# Patient Record
Sex: Male | Born: 1965 | Race: White | Hispanic: No | Marital: Single | Smoking: Never smoker
Health system: Southern US, Community
[De-identification: ages and names within clinical notes are randomized; demographics above are authoritative.]

## PROBLEM LIST (undated history)

## (undated) HISTORY — PX: NO PAST SURGERIES: SHX2092

---

## 2013-03-12 ENCOUNTER — Ambulatory Visit: Payer: BC Managed Care – PPO | Admitting: Family Medicine

## 2013-03-12 VITALS — BP 110/76 | HR 95 | Temp 100.3°F | Resp 18 | Ht 73.0 in | Wt 223.8 lb

## 2013-03-12 DIAGNOSIS — R509 Fever, unspecified: Secondary | ICD-10-CM

## 2013-03-12 DIAGNOSIS — J029 Acute pharyngitis, unspecified: Secondary | ICD-10-CM

## 2013-03-12 DIAGNOSIS — B9789 Other viral agents as the cause of diseases classified elsewhere: Secondary | ICD-10-CM

## 2013-03-12 DIAGNOSIS — B349 Viral infection, unspecified: Secondary | ICD-10-CM

## 2013-03-12 DIAGNOSIS — R350 Frequency of micturition: Secondary | ICD-10-CM

## 2013-03-12 DIAGNOSIS — R319 Hematuria, unspecified: Secondary | ICD-10-CM

## 2013-03-12 LAB — POCT URINALYSIS DIPSTICK
Nitrite, UA: NEGATIVE
Spec Grav, UA: 1.015
Urobilinogen, UA: 0.2

## 2013-03-12 LAB — POCT CBC
HCT, POC: 46.7 % (ref 43.5–53.7)
Hemoglobin: 15.4 g/dL (ref 14.1–18.1)
Lymph, poc: 1.1 (ref 0.6–3.4)
MCH, POC: 33.5 pg — AB (ref 27–31.2)
MCHC: 33 g/dL (ref 31.8–35.4)
MPV: 8.2 fL (ref 0–99.8)
POC Granulocyte: 4.3 (ref 2–6.9)
POC LYMPH PERCENT: 19 %L (ref 10–50)
POC MID %: 6.9 %M (ref 0–12)
RDW, POC: 12.4 %
WBC: 5.8 10*3/uL (ref 4.6–10.2)

## 2013-03-12 LAB — POCT UA - MICROSCOPIC ONLY
Casts, Ur, LPF, POC: NEGATIVE
Crystals, Ur, HPF, POC: NEGATIVE
Mucus, UA: NEGATIVE
Yeast, UA: NEGATIVE

## 2013-03-12 NOTE — Progress Notes (Signed)
[address]   [place], [place]  [address]   [phone]  Subjective:    Patient ID: Kyle Leonard, male    DOB: 12-Sep-1965, 47 y.o.   MRN: [id number]  Cough Associated symptoms include chills, a fever, headaches and a sore throat. Pertinent negatives include no ear pain, rash, rhinorrhea, shortness of breath or wheezing.  Fever  Associated symptoms include abdominal pain, coughing, headaches and a sore throat. Pertinent negatives include no congestion, diarrhea, ear pain, nausea, rash, vomiting or wheezing.   This 47 y.o. male presents for evaluation of cough, fever.  Onset two days ago.  +fever Tmax unknown; taking Advil.  Mild headache.  +ST mildly today. No ear pain.  No rhinorrhea.  No nasal congestion.  +mild coughing last night.  No SOB; no sputum production. No eye redness.  No n/v.  No diarrhea.  +abdominal pain.  No sick contacts.  Investment banker, corporate.  No recent travel.  Took Nyquil.  +myalgias.  No tobacco.  No flu vaccine.  No dysuria, urgency,hematuria but +frequency; married; one sexual partner in past year.  PCP:  none  Review of Systems  Constitutional: Positive for fever, chills, diaphoresis and fatigue.  HENT: Positive for sore throat. Negative for congestion, ear pain, rhinorrhea, trouble swallowing and voice change.   Respiratory: Positive for cough. Negative for shortness of breath, wheezing and stridor.   Gastrointestinal: Positive for abdominal pain. Negative for nausea, vomiting and diarrhea.  Genitourinary: Positive for frequency. Negative for dysuria, hematuria and flank pain.  Skin: Negative for rash.  Neurological: Positive for headaches. Negative for dizziness and light-headedness.   History reviewed. No pertinent past medical history. History reviewed. No pertinent past surgical history. No Known Allergies No current outpatient prescriptions on file prior to visit.   No current facility-administered medications on file prior to visit.   History   Social  History  . Marital Status: Single    Spouse Name: N/A    Number of Children: N/A  . Years of Education: N/A   Occupational History  . employed     Investment banker, corporate   Social History Main Topics  . Smoking status: Never Smoker   . Smokeless tobacco: Not on file  . Alcohol Use: Not on file  . Drug Use: No  . Sexual Activity: Yes   Other Topics Concern  . Not on file   Social History Narrative  . No narrative on file       Objective:   Physical Exam  Constitutional: He is oriented to person, place, and time. He appears well-developed and well-nourished. No distress.  HENT:  Head: Normocephalic and atraumatic.  Right Ear: External ear normal.  Left Ear: External ear normal.  Nose: Nose normal.  Mouth/Throat: Oropharynx is clear and moist.  Eyes: Conjunctivae and EOM are normal. Pupils are equal, round, and reactive to light.  Neck: Normal range of motion. Neck supple.  Cardiovascular: Normal rate, regular rhythm and normal heart sounds.   No murmur heard. Pulmonary/Chest: Effort normal and breath sounds normal. He has no wheezes. He has no rales.  Abdominal: Soft. Bowel sounds are normal. He exhibits no distension. There is no tenderness. There is no rebound and no guarding.  Lymphadenopathy:    He has no cervical adenopathy.  Neurological: He is alert and oriented to person, place, and time.  Skin: Skin is warm and dry. No rash noted. He is not diaphoretic.  Psychiatric: He has a normal mood and affect. His behavior is normal.  Results for orders placed in visit on 03/12/13  POCT INFLUENZA A/B      Result Value Range   Influenza A, POC Negative     Influenza B, POC Negative    POCT CBC      Result Value Range   WBC 5.8  4.6 - 10.2 K/uL   Lymph, poc 1.1  0.6 - 3.4   POC LYMPH PERCENT 19.0  10 - 50 %L   MID (cbc) 0.4  0 - 0.9   POC MID % 6.9  0 - 12 %M   POC Granulocyte 4.3  2 - 6.9   Granulocyte percent 74.1  37 - 80 %G   RBC 4.60 (*) 4.69 - 6.13 M/uL    Hemoglobin 15.4  14.1 - 18.1 g/dL   HCT, POC 40.9  81.1 - 53.7 %   MCV 101.6 (*) 80 - 97 fL   MCH, POC 33.5 (*) 27 - 31.2 pg   MCHC 33.0  31.8 - 35.4 g/dL   RDW, POC 91.4     Platelet Count, POC 189  142 - 424 K/uL   MPV 8.2  0 - 99.8 fL  POCT RAPID STREP A (OFFICE)      Result Value Range   Rapid Strep A Screen Negative  Negative  POCT UA - MICROSCOPIC ONLY      Result Value Range   WBC, Ur, HPF, POC 0-1     RBC, urine, microscopic 3-6     Bacteria, U Microscopic trace     Mucus, UA neg     Epithelial cells, urine per micros neg     Crystals, Ur, HPF, POC neg     Casts, Ur, LPF, POC neg     Yeast, UA neg    POCT URINALYSIS DIPSTICK      Result Value Range   Color, UA yellow     Clarity, UA clear     Glucose, UA neg     Bilirubin, UA neg     Ketones, UA neg     Spec Grav, UA 1.015     Blood, UA moderate     pH, UA 6.5     Protein, UA trace     Urobilinogen, UA 0.2     Nitrite, UA neg     Leukocytes, UA Negative         Assessment & Plan:  Fever, unspecified - Plan: POCT Influenza A/B, POCT CBC, POCT rapid strep A, POCT UA - Microscopic Only, POCT urinalysis dipstick, Culture, Group A Strep  Urinary frequency - Plan: POCT UA - Microscopic Only, POCT urinalysis dipstick  Sore throat - Plan: POCT CBC, POCT rapid strep A, Culture, Group A Strep  Viral syndrome  Hematuria - Plan: Urine culture  1.  Fever:  New. Associated with sore throat, cough, abdominal pain.  Recommend supportive care with rest,fluids, Nyquil, Mucinex. RTC for acute worsening. 2.  Hematuria: New. Associated with fever, abdominal pain, urinary frequency; send urine culture.  No suggestion of acute nephrolithiasis by exam.  RTC 2-4 weeks for repeat urine.

## 2013-03-12 NOTE — Patient Instructions (Signed)
Viral Syndrome  You or your child has Viral Syndrome. It is the most common infection causing "colds" and infections in the nose, throat, sinuses, and breathing tubes. Sometimes the infection causes nausea, vomiting, or diarrhea. The germ that causes the infection is a virus. No antibiotic or other medicine will kill it. There are medicines that you can take to make you or your child more comfortable.   HOME CARE INSTRUCTIONS    Rest in bed until you start to feel better.   If you have diarrhea or vomiting, eat small amounts of crackers and toast. Soup is helpful.   Do not give aspirin or medicine that contains aspirin to children.   Only take over-the-counter or prescription medicines for pain, discomfort, or fever as directed by your caregiver.  SEEK IMMEDIATE MEDICAL CARE IF:    You or your child has not improved within one week.   You or your child has pain that is not at least partially relieved by over-the-counter medicine.   Thick, colored mucus or blood is coughed up.   Discharge from the nose becomes thick yellow or green.   Diarrhea or vomiting gets worse.   There is any major change in your or your child's condition.   You or your child develops a skin rash, stiff neck, severe headache, or are unable to hold down food or fluid.   You or your child has an oral temperature above 102 F (38.9 C), not controlled by medicine.   Your baby is older than 3 months with a rectal temperature of 102 F (38.9 C) or higher.   Your baby is 3 months old or younger with a rectal temperature of 100.4 F (38 C) or higher.  Document Released: 04/23/2006 Document Revised: 07/31/2011 Document Reviewed: 04/24/2007  Exit[employer] Patient Information 2014 [employer].

## 2013-03-14 LAB — CULTURE, GROUP A STREP: Organism ID, Bacteria: NORMAL

## 2013-03-18 NOTE — Progress Notes (Signed)
Left Message regarding scheduling appointment with Dr Katrinka Blazing for a f/u

## 2013-03-20 ENCOUNTER — Telehealth: Payer: Self-pay | Admitting: Radiology

## 2013-03-20 NOTE — Telephone Encounter (Signed)
Spoke to patient about labs, he is feeling better.

## 2013-04-15 ENCOUNTER — Encounter: Payer: Self-pay | Admitting: Family Medicine

## 2013-04-15 ENCOUNTER — Ambulatory Visit: Payer: BC Managed Care – PPO | Admitting: Family Medicine

## 2013-04-15 VITALS — BP 126/72 | HR 69 | Temp 97.9°F | Resp 16 | Ht 74.5 in | Wt 228.0 lb

## 2013-04-15 DIAGNOSIS — Z23 Encounter for immunization: Secondary | ICD-10-CM

## 2013-04-15 DIAGNOSIS — R319 Hematuria, unspecified: Secondary | ICD-10-CM

## 2013-04-15 LAB — POCT URINALYSIS DIPSTICK
Bilirubin, UA: NEGATIVE
Blood, UA: NEGATIVE
Ketones, UA: NEGATIVE
Leukocytes, UA: NEGATIVE
Spec Grav, UA: 1.02
Urobilinogen, UA: 0.2
pH, UA: 7

## 2013-04-15 LAB — POCT UA - MICROSCOPIC ONLY
Mucus, UA: NEGATIVE
RBC, urine, microscopic: NEGATIVE

## 2013-04-15 NOTE — Progress Notes (Signed)
Subjective:    Patient ID: Kyle Leonard, male    DOB: 10/14/1965, 47 y.o.   MRN: [id number]  HPI This 47 y.o. male presents for one month follow-up of the following:  1. Viral syndrome: acute illness continued x 5 days; fever x 5 days; mild sore throat; mild cough; no other associated symptoms other than body aches. Convinced that had the flu.  No rhinorrhea, nasal congestion, SOB, abdominal pain, n/v/d.  2.  Hematuria: present at visit; presenting for recheck. No dysuria, gross hematuria, urgency; +frequency at time of visit.  No previous hematuria in past.  Feels well today.  3. Flu vaccine: agreeable.   Review of Systems  Constitutional: Negative for fever, chills, diaphoresis and fatigue.  HENT: Negative for congestion, ear pain, postnasal drip, rhinorrhea, sneezing, sore throat and trouble swallowing.   Respiratory: Negative for cough and shortness of breath.   Genitourinary: Negative for dysuria, urgency, frequency, hematuria, flank pain, discharge, penile swelling, scrotal swelling, genital sores, penile pain and testicular pain.   History reviewed. No pertinent past medical history. History reviewed. No pertinent past surgical history. No Known Allergies History   Social History  . Marital Status: Single    Spouse Name: N/A    Number of Children: N/A  . Years of Education: N/A   Occupational History  . employed     Investment banker, corporate   Social History Main Topics  . Smoking status: Never Smoker   . Smokeless tobacco: Not on file  . Alcohol Use: Not on file  . Drug Use: No  . Sexual Activity: Yes   Other Topics Concern  . Not on file   Social History Narrative  . No narrative on file   History reviewed. No pertinent family history.     Objective:   Physical Exam  Nursing note and vitals reviewed. Constitutional: He is oriented to person, place, and time. He appears well-developed and well-nourished. No distress.  HENT:  Head: Normocephalic and atraumatic.    Right Ear: External ear normal.  Left Ear: External ear normal.  Nose: Nose normal.  Mouth/Throat: Oropharynx is clear and moist.  Eyes: Conjunctivae and EOM are normal. Pupils are equal, round, and reactive to light.  Neck: Normal range of motion. Neck supple. Carotid bruit is not present. No thyromegaly present.  Cardiovascular: Normal rate, regular rhythm, normal heart sounds and intact distal pulses.  Exam reveals no gallop and no friction rub.   No murmur heard. Pulmonary/Chest: Effort normal and breath sounds normal. He has no wheezes. He has no rales.  Abdominal: Soft. Bowel sounds are normal. He exhibits no distension and no mass. There is no tenderness. There is no rebound and no guarding.  Lymphadenopathy:    He has no cervical adenopathy.  Neurological: He is alert and oriented to person, place, and time. No cranial nerve deficit.  Skin: Skin is warm and dry. No rash noted. He is not diaphoretic.  Psychiatric: He has a normal mood and affect. His behavior is normal.          Results for orders placed in visit on 04/15/13  POCT UA - MICROSCOPIC ONLY      Result Value Range   WBC, Ur, HPF, POC 0-1     RBC, urine, microscopic neg     Bacteria, U Microscopic trace     Mucus, UA neg     Epithelial cells, urine per micros 0-1     Crystals, Ur, HPF, POC neg  Casts, Ur, LPF, POC neg     Yeast, UA neg    POCT URINALYSIS DIPSTICK      Result Value Range   Color, UA yellow     Clarity, UA clear     Glucose, UA neg     Bilirubin, UA neg     Ketones, UA neg     Spec Grav, UA 1.020     Blood, UA neg     pH, UA 7.0     Protein, UA neg     Urobilinogen, UA 0.2     Nitrite, UA neg     Leukocytes, UA Negative      Assessment & Plan:  Hematuria - Plan: POCT UA - Microscopic Only, POCT urinalysis dipstick  Need for prophylactic vaccination and inoculation against influenza  1. Hematuria:  New at last visit and now resolved. Asymptomatic at this time. 2.  S/p flu  vaccine.  No orders of the defined types were placed in this encounter.   Nilda Simmer, M.D.  [facility] [address] [place], [place]  [address] [phone] phone [phone] fax

## 2016-02-16 ENCOUNTER — Ambulatory Visit (INDEPENDENT_AMBULATORY_CARE_PROVIDER_SITE_OTHER): Payer: BLUE CROSS/BLUE SHIELD | Admitting: Family Medicine

## 2016-02-16 ENCOUNTER — Ambulatory Visit (INDEPENDENT_AMBULATORY_CARE_PROVIDER_SITE_OTHER): Payer: BLUE CROSS/BLUE SHIELD

## 2016-02-16 VITALS — BP 124/82 | HR 78 | Temp 97.7°F | Resp 17 | Ht 74.5 in | Wt 214.0 lb

## 2016-02-16 DIAGNOSIS — W540XXA Bitten by dog, initial encounter: Secondary | ICD-10-CM | POA: Diagnosis not present

## 2016-02-16 DIAGNOSIS — M7989 Other specified soft tissue disorders: Secondary | ICD-10-CM

## 2016-02-16 DIAGNOSIS — S60511A Abrasion of right hand, initial encounter: Secondary | ICD-10-CM | POA: Diagnosis not present

## 2016-02-16 DIAGNOSIS — R2231 Localized swelling, mass and lump, right upper limb: Secondary | ICD-10-CM

## 2016-02-16 LAB — CBC WITH DIFFERENTIAL/PLATELET
Basophils Absolute: 56 cells/uL (ref 0–200)
Basophils Relative: 1 %
EOS ABS: 168 {cells}/uL (ref 15–500)
Eosinophils Relative: 3 %
HEMATOCRIT: 41.2 % (ref 38.5–50.0)
Hemoglobin: 13.7 g/dL (ref 13.2–17.1)
LYMPHS PCT: 34 %
Lymphs Abs: 1904 cells/uL (ref 850–3900)
MCH: 32.1 pg (ref 27.0–33.0)
MCHC: 33.3 g/dL (ref 32.0–36.0)
MCV: 96.5 fL (ref 80.0–100.0)
MONO ABS: 448 {cells}/uL (ref 200–950)
MONOS PCT: 8 %
MPV: 9.5 fL (ref 7.5–12.5)
NEUTROS PCT: 54 %
Neutro Abs: 3024 cells/uL (ref 1500–7800)
PLATELETS: 227 10*3/uL (ref 140–400)
RBC: 4.27 MIL/uL (ref 4.20–5.80)
RDW: 12.7 % (ref 11.0–15.0)
WBC: 5.6 10*3/uL (ref 3.8–10.8)

## 2016-02-16 MED ORDER — DOXYCYCLINE HYCLATE 100 MG PO TABS: 100.0000 mg | ORAL_TABLET | Freq: Two times a day (BID) | ORAL | 0 refills | Status: DC

## 2016-02-16 MED ORDER — AMOXICILLIN-POT CLAVULANATE 875-125 MG PO TABS: 1.0000 | ORAL_TABLET | Freq: Two times a day (BID) | ORAL | 0 refills | Status: DC

## 2016-02-16 NOTE — Progress Notes (Addendum)
Kyle Leonard is a 50 y.o. male who presents to [facility] today for dog bite:  1.  Dog bite to Right hand.:  Patient suffered a dog bite to his right hand one week ago today. It was the patient's own pet and he was stuck in a fence. The patient was trying to help the dog become untangled when that became upset and bit his hand. The dog is up-to-date on rabies vaccinations.    Since then the patient has been taking regular ibuprofen to help with the swelling. He is also applying ice. He has noted persistent swelling in his right hand since the bite. The pain is fairly well controlled except when he tries to completely extend all of his fingers. The swelling is over the third metacarpal joint. Maximal point tenderness is here. He noticed some redness which started yesterday. Heat in the area as well. Evidently he had an accident about 25 years ago and has only small portion of that joint remaining.  No nausea vomiting. No fevers or chills. No red streaks extending up his arm.  ROS as above.   PMH reviewed. Patient is a nonsmoker.   No past medical history on file. No past surgical history on file.  Medications reviewed. Current Outpatient Prescriptions  Medication Sig Dispense Refill  . ibuprofen (ADVIL,MOTRIN) 200 MG tablet Take 200 mg by mouth every 6 (six) hours as needed.     No current facility-administered medications for this visit.      Physical Exam:  BP 124/82 (BP Location: Right Arm, Patient Position: Sitting, Cuff Size: Large)   Pulse 78   Temp 97.7 F (36.5 C) (Oral)   Resp 17   Ht 6' 2.5" (1.892 m)   Wt 214 lb (97.1 kg)   SpO2 99%   BMI 27.11 kg/m  Gen:  Alert, cooperative patient who appears stated age in no acute distress.  Vital signs reviewed. HEENT: EOMI,  MMM MSK:  Left ear within normal limits. Right hand with fairly significant edema on dorsum of hand. Does have a healing abrasion located directly over third MCP joint. This area is  edematous and erythematous. I do note heat being given off. Patient is able to extend his fingers to almost 180 but this does reproduce pain in his hand. He is more comfortable letting his fingers rested about 90 flexion. Sensation and neurovascularly intact.  Tenderness is mild but present along the polar aspect of his MCP joint as well.  Assessment and Plan:  1.  Right hand infection: - with questionable MCP joint involvement.  Reassured by lack of progression over past week. Hand xray showed dorsal swelling with normal bone appearance, no evidence of osteomyelitis. - Contacted and surgeon on call for today who is Dr. Mina MarbleWeingold, who is currently in surgery. I spoke with his nurse assistant who spoke with the surgeon. Recommendations were either to send him straight to the ER, or due to his reliability and lack of systemic symptoms, start him on antibiotics and recheck in 48 hours. -I would favor the 48 hour approach. I spoke with the patient who also favors this. -Starting him on Augmentin for the hand right. Also currently with doxycycline for any concerns for MRSA. -We are getting a white count today. Can repeat on Friday. -Patient will make a appointment to come back on Friday to be reevaluated. - Does NOT appear that patient has had a recent tetanus.  Discovered this after patient had left.  NEEDS to obtain tetanus next visit on Friday.

## 2016-02-16 NOTE — Patient Instructions (Addendum)
I will call you in a just a little bit with the recommendations.  I'll also send in antibiotics for you at that point.   Blood test today.      IF you received an x-Kader today, you will receive an invoice from [facility]. Please contact [facility] at [phone] with questions or concerns regarding your invoice.   IF you received labwork today, you will receive an invoice from [facility]. Please contact [facility] at [phone] with questions or concerns regarding your invoice.   Our billing staff will not be able to assist you with questions regarding bills from these companies.  You will be contacted with the lab results as soon as they are available. The fastest way to get your results is to activate your My Chart account. Instructions are located on the last page of this paperwork. If you have not heard from us regarding the results in 2 weeks, please contact this office.

## 2016-02-18 ENCOUNTER — Ambulatory Visit: Payer: BLUE CROSS/BLUE SHIELD

## 2016-02-26 ENCOUNTER — Telehealth: Payer: Self-pay

## 2016-02-26 NOTE — Telephone Encounter (Signed)
PATIENT SAW DR. Gwendolyn GrantWALDEN ABOUT A WEEK AGO FOR A DOG BITE ON HIS (R) HAND. THEY TALKED ABOUT HIM GOING TO SEE A HAND SPECIALIST. HE WOULD LIKE DR. Gwendolyn GrantWALDEN TO GO AHEAD AND PUT IN THE REFERRAL. BEST PHONE [phone] (CELL)  MBC

## 2016-02-28 ENCOUNTER — Telehealth: Payer: Self-pay | Admitting: Emergency Medicine

## 2016-02-28 ENCOUNTER — Telehealth: Payer: Self-pay

## 2016-02-28 NOTE — Telephone Encounter (Signed)
Left a message explaining to patient he will need to return to clinic for re-evaluation of hand before referral.

## 2016-02-28 NOTE — Telephone Encounter (Signed)
Wants a referral to hand specialist.    [phone]

## 2016-02-28 NOTE — Telephone Encounter (Signed)
Pt requesting hand specialist Please f/u

## 2016-02-28 NOTE — Telephone Encounter (Signed)
The plan was for the patient to return in 48 hours to be re-evaluated and see if the antibiotics worked.  If not, he would need to be admitted for IV abx and likely surgery.  This was discussed with the hand surgeon on call as well as the patient.  It looks like he didn't follow up within 48 hours after looking through the chart.    He should be re-evaluated either with us or the ED because if he still requires intervention with a hand surgeon, that means he has failed treatment with outpatient antibiotics, and he would likely need to be admitted to the hospital for IV abx and surgery.  We would need to see him in person to do this.  A referral to the hand surgeon is inappropriate because it will likely take at least a week or two if not longer to be seen there.  Please call and let him know.  Thanks!  JW

## 2016-02-29 ENCOUNTER — Encounter (HOSPITAL_COMMUNITY): Payer: Self-pay | Admitting: *Deleted

## 2016-03-01 ENCOUNTER — Ambulatory Visit (HOSPITAL_COMMUNITY): Payer: BLUE CROSS/BLUE SHIELD | Admitting: Certified Registered Nurse Anesthetist

## 2016-03-01 ENCOUNTER — Encounter (HOSPITAL_COMMUNITY)
Admission: RE | Disposition: A | Discharge status: Home / Self Care | Payer: Self-pay | Source: Ambulatory Visit | Attending: Orthopedic Surgery

## 2016-03-01 ENCOUNTER — Ambulatory Visit (HOSPITAL_COMMUNITY)
Admission: RE | Admit: 2016-03-01 | Discharge: 2016-03-01 | Disposition: A | Discharge status: Home / Self Care | Payer: BLUE CROSS/BLUE SHIELD | Source: Ambulatory Visit | Attending: Orthopedic Surgery | Admitting: Orthopedic Surgery

## 2016-03-01 DIAGNOSIS — L02511 Cutaneous abscess of right hand: Secondary | ICD-10-CM | POA: Diagnosis not present

## 2016-03-01 DIAGNOSIS — W540XXA Bitten by dog, initial encounter: Secondary | ICD-10-CM | POA: Diagnosis not present

## 2016-03-01 HISTORY — PX: I & D EXTREMITY: SHX5045

## 2016-03-01 SURGERY — IRRIGATION AND DEBRIDEMENT EXTREMITY
Anesthesia: General | Laterality: Right

## 2016-03-01 MED ORDER — FENTANYL CITRATE (PF) 100 MCG/2ML IJ SOLN
INTRAMUSCULAR | Status: DC | PRN
  Administered 2016-03-01 (×2): 50 ug via INTRAVENOUS

## 2016-03-01 MED ORDER — DEXAMETHASONE SODIUM PHOSPHATE 10 MG/ML IJ SOLN
INTRAMUSCULAR | Status: DC | PRN
  Administered 2016-03-01: 10 mg via INTRAVENOUS

## 2016-03-01 MED ORDER — FENTANYL CITRATE (PF) 100 MCG/2ML IJ SOLN
INTRAMUSCULAR | Status: AC
  Filled 2016-03-01: qty 4

## 2016-03-01 MED ORDER — KETOROLAC TROMETHAMINE 30 MG/ML IJ SOLN: 30.0000 mg | Freq: Once | INTRAMUSCULAR | Status: DC | PRN

## 2016-03-01 MED ORDER — CHLORHEXIDINE GLUCONATE 4 % EX LIQD: 60.0000 mL | Freq: Once | CUTANEOUS | Status: DC

## 2016-03-01 MED ORDER — LIDOCAINE 2% (20 MG/ML) 5 ML SYRINGE
INTRAMUSCULAR | Status: DC | PRN
  Administered 2016-03-01: 60 mg via INTRAVENOUS

## 2016-03-01 MED ORDER — BUPIVACAINE HCL (PF) 0.25 % IJ SOLN
INTRAMUSCULAR | Status: AC
  Filled 2016-03-01: qty 30

## 2016-03-01 MED ORDER — 0.9 % SODIUM CHLORIDE (POUR BTL) OPTIME
TOPICAL | Status: DC | PRN
  Administered 2016-03-01: 1000 mL

## 2016-03-01 MED ORDER — LIDOCAINE 2% (20 MG/ML) 5 ML SYRINGE
INTRAMUSCULAR | Status: AC
  Filled 2016-03-01: qty 10

## 2016-03-01 MED ORDER — PROPOFOL 10 MG/ML IV BOLUS
INTRAVENOUS | Status: DC | PRN
  Administered 2016-03-01: 100 mg via INTRAVENOUS
  Administered 2016-03-01: 200 mg via INTRAVENOUS

## 2016-03-01 MED ORDER — PROPOFOL 10 MG/ML IV BOLUS
INTRAVENOUS | Status: AC
  Filled 2016-03-01: qty 20

## 2016-03-01 MED ORDER — ONDANSETRON HCL 4 MG/2ML IJ SOLN
INTRAMUSCULAR | Status: DC | PRN
  Administered 2016-03-01: 4 mg via INTRAVENOUS

## 2016-03-01 MED ORDER — AMPICILLIN-SULBACTAM SODIUM 3 (2-1) G IJ SOLR
3.0000 g | INTRAMUSCULAR | Status: AC
  Administered 2016-03-01: 3 g via INTRAVENOUS
  Filled 2016-03-01: qty 3

## 2016-03-01 MED ORDER — PROMETHAZINE HCL 25 MG/ML IJ SOLN: 6.2500 mg | INTRAMUSCULAR | Status: DC | PRN

## 2016-03-01 MED ORDER — ONDANSETRON HCL 4 MG/2ML IJ SOLN
INTRAMUSCULAR | Status: AC
  Filled 2016-03-01: qty 4

## 2016-03-01 MED ORDER — FENTANYL CITRATE (PF) 100 MCG/2ML IJ SOLN
INTRAMUSCULAR | Status: AC
  Filled 2016-03-01: qty 2

## 2016-03-01 MED ORDER — AMOXICILLIN-POT CLAVULANATE 875-125 MG PO TABS: 1.0000 | ORAL_TABLET | Freq: Two times a day (BID) | ORAL | 0 refills | Status: DC

## 2016-03-01 MED ORDER — DEXMEDETOMIDINE HCL IN NACL 200 MCG/50ML IV SOLN
INTRAVENOUS | Status: AC
  Filled 2016-03-01: qty 50

## 2016-03-01 MED ORDER — OXYCODONE-ACETAMINOPHEN 5-325 MG PO TABS: 1.0000 | ORAL_TABLET | Freq: Three times a day (TID) | ORAL | 0 refills | Status: AC

## 2016-03-01 MED ORDER — LACTATED RINGERS IV SOLN
INTRAVENOUS | Status: DC
  Administered 2016-03-01: 16:00:00 via INTRAVENOUS

## 2016-03-01 MED ORDER — MIDAZOLAM HCL 5 MG/5ML IJ SOLN
INTRAMUSCULAR | Status: DC | PRN
  Administered 2016-03-01: 2 mg via INTRAVENOUS

## 2016-03-01 MED ORDER — FENTANYL CITRATE (PF) 100 MCG/2ML IJ SOLN
25.0000 ug | INTRAMUSCULAR | Status: DC | PRN
  Administered 2016-03-01 (×2): 50 ug via INTRAVENOUS

## 2016-03-01 MED ORDER — MIDAZOLAM HCL 2 MG/2ML IJ SOLN
INTRAMUSCULAR | Status: AC
  Filled 2016-03-01: qty 2

## 2016-03-01 MED ORDER — DEXMEDETOMIDINE HCL 200 MCG/2ML IV SOLN
INTRAVENOUS | Status: DC | PRN
  Administered 2016-03-01: 12 ug via INTRAVENOUS

## 2016-03-01 MED ORDER — DEXAMETHASONE SODIUM PHOSPHATE 10 MG/ML IJ SOLN
INTRAMUSCULAR | Status: AC
  Filled 2016-03-01: qty 1

## 2016-03-01 SURGICAL SUPPLY — 59 items
BANDAGE ACE 4X5 VEL STRL LF (GAUZE/BANDAGES/DRESSINGS) ×3 IMPLANT
BANDAGE ELASTIC 3 VELCRO ST LF (GAUZE/BANDAGES/DRESSINGS) ×3 IMPLANT
BANDAGE ELASTIC 4 VELCRO ST LF (GAUZE/BANDAGES/DRESSINGS) ×6 IMPLANT
BNDG COHESIVE 1X5 TAN STRL LF (GAUZE/BANDAGES/DRESSINGS) IMPLANT
BNDG CONFORM 2 STRL LF (GAUZE/BANDAGES/DRESSINGS) IMPLANT
BNDG ESMARK 4X9 LF (GAUZE/BANDAGES/DRESSINGS) ×3 IMPLANT
BNDG GAUZE ELAST 4 BULKY (GAUZE/BANDAGES/DRESSINGS) ×6 IMPLANT
CORDS BIPOLAR (ELECTRODE) ×3 IMPLANT
COVER SURGICAL LIGHT HANDLE (MISCELLANEOUS) ×3 IMPLANT
CUFF TOURNIQUET SINGLE 18IN (TOURNIQUET CUFF) ×3 IMPLANT
CUFF TOURNIQUET SINGLE 24IN (TOURNIQUET CUFF) IMPLANT
DRAIN PENROSE 1/4X12 LTX STRL (WOUND CARE) IMPLANT
DRAPE SURG 17X23 STRL (DRAPES) ×3 IMPLANT
DRSG ADAPTIC 3X8 NADH LF (GAUZE/BANDAGES/DRESSINGS) ×3 IMPLANT
ELECT REM PT RETURN 9FT ADLT (ELECTROSURGICAL)
ELECTRODE REM PT RTRN 9FT ADLT (ELECTROSURGICAL) IMPLANT
GAUZE SPONGE 4X4 12PLY STRL (GAUZE/BANDAGES/DRESSINGS) ×3 IMPLANT
GAUZE XEROFORM 1X8 LF (GAUZE/BANDAGES/DRESSINGS) ×3 IMPLANT
GAUZE XEROFORM 5X9 LF (GAUZE/BANDAGES/DRESSINGS) IMPLANT
GLOVE BIOGEL PI IND STRL 8.5 (GLOVE) ×1 IMPLANT
GLOVE BIOGEL PI INDICATOR 8.5 (GLOVE) ×2
GLOVE INDICATOR 7.5 STRL GRN (GLOVE) ×3 IMPLANT
GLOVE SURG ORTHO 8.0 STRL STRW (GLOVE) ×3 IMPLANT
GOWN STRL REUS W/ TWL LRG LVL3 (GOWN DISPOSABLE) ×3 IMPLANT
GOWN STRL REUS W/ TWL XL LVL3 (GOWN DISPOSABLE) ×1 IMPLANT
GOWN STRL REUS W/TWL LRG LVL3 (GOWN DISPOSABLE) ×6
GOWN STRL REUS W/TWL XL LVL3 (GOWN DISPOSABLE) ×2
HANDPIECE INTERPULSE COAX TIP (DISPOSABLE)
KIT BASIN OR (CUSTOM PROCEDURE TRAY) ×3 IMPLANT
KIT ROOM TURNOVER OR (KITS) ×3 IMPLANT
MANIFOLD NEPTUNE II (INSTRUMENTS) ×3 IMPLANT
NEEDLE HYPO 25GX1X1/2 BEV (NEEDLE) IMPLANT
NS IRRIG 1000ML POUR BTL (IV SOLUTION) ×3 IMPLANT
PACK ORTHO EXTREMITY (CUSTOM PROCEDURE TRAY) ×3 IMPLANT
PAD ARMBOARD 7.5X6 YLW CONV (MISCELLANEOUS) ×6 IMPLANT
PAD CAST 4YDX4 CTTN HI CHSV (CAST SUPPLIES) ×1 IMPLANT
PADDING CAST COTTON 4X4 STRL (CAST SUPPLIES) ×2
SET HNDPC FAN SPRY TIP SCT (DISPOSABLE) IMPLANT
SOAP 2 % CHG 4 OZ (WOUND CARE) ×3 IMPLANT
SPLINT FIBERGLASS 3X12 (CAST SUPPLIES) ×3 IMPLANT
SPONGE LAP 18X18 X RAY DECT (DISPOSABLE) ×3 IMPLANT
SPONGE LAP 4X18 X RAY DECT (DISPOSABLE) ×3 IMPLANT
SUCTION FRAZIER HANDLE 10FR (MISCELLANEOUS) ×2
SUCTION TUBE FRAZIER 10FR DISP (MISCELLANEOUS) ×1 IMPLANT
SUT ETHIBOND 4 0 TF (SUTURE) ×3 IMPLANT
SUT ETHILON 4 0 PS 2 18 (SUTURE) ×3 IMPLANT
SUT ETHILON 5 0 P 3 18 (SUTURE)
SUT NYLON ETHILON 5-0 P-3 1X18 (SUTURE) IMPLANT
SWAB COLLECTION DEVICE MRSA (MISCELLANEOUS) ×3 IMPLANT
SWAB CULTURE ESWAB REG 1ML (MISCELLANEOUS) ×3 IMPLANT
SYR CONTROL 10ML LL (SYRINGE) IMPLANT
TOWEL OR 17X24 6PK STRL BLUE (TOWEL DISPOSABLE) ×3 IMPLANT
TOWEL OR 17X26 10 PK STRL BLUE (TOWEL DISPOSABLE) ×3 IMPLANT
TUBE ANAEROBIC SPECIMEN COL (MISCELLANEOUS) IMPLANT
TUBE CONNECTING 12'X1/4 (SUCTIONS) ×1
TUBE CONNECTING 12X1/4 (SUCTIONS) ×2 IMPLANT
UNDERPAD 30X30 (UNDERPADS AND DIAPERS) ×3 IMPLANT
WATER STERILE IRR 1000ML POUR (IV SOLUTION) ×3 IMPLANT
YANKAUER SUCT BULB TIP NO VENT (SUCTIONS) ×3 IMPLANT

## 2016-03-01 NOTE — Transfer of Care (Signed)
Immediate Anesthesia Transfer of Care Note  Patient: Kyle Leonard  Procedure(s) Performed: Procedure(s): IRRIGATION AND DEBRIDEMENT OPEN WOUND RIGHT HAND (Right)  Patient Location: PACU  Anesthesia Type:General  Level of Consciousness: awake and patient cooperative  Airway & Oxygen Therapy: Patient Spontanous Breathing and Patient connected to nasal cannula oxygen  Post-op Assessment: Report given to RN and Post -op Vital signs reviewed and stable  Post vital signs: Reviewed and stable  Last Vitals:  Vitals:   03/01/16 1528 03/01/16 1910  BP: (!) 128/91   Pulse: 61   Resp: 18   Temp: 36.7 C (P) 36.6 C    Last Pain:  Vitals:   03/01/16 1536  TempSrc:   PainSc: 5       Patients Stated Pain Goal: 3 (03/01/16 1536)  Complications: No apparent anesthesia complications

## 2016-03-01 NOTE — Anesthesia Preprocedure Evaluation (Signed)
Anesthesia Evaluation  Patient identified by MRN, date of birth, ID band Patient awake    Reviewed: Allergy & Precautions, NPO status , Patient's Chart, lab work & pertinent test results  Airway Mallampati: II  TM Distance: >3 FB Neck ROM: Full    Dental no notable dental hx.    Pulmonary neg pulmonary ROS,    Pulmonary exam normal breath sounds clear to auscultation       Cardiovascular negative cardio ROS Normal cardiovascular exam Rhythm:Regular Rate:Normal     Neuro/Psych negative neurological ROS  negative psych ROS   GI/Hepatic negative GI ROS, Neg liver ROS,   Endo/Other  negative endocrine ROS  Renal/GU negative Renal ROS  negative genitourinary   Musculoskeletal negative musculoskeletal ROS (+)   Abdominal   Peds negative pediatric ROS (+)  Hematology negative hematology ROS (+)   Anesthesia Other Findings   Reproductive/Obstetrics negative OB ROS                             Anesthesia Physical Anesthesia Plan  ASA: I  Anesthesia Plan: General   Post-op Pain Management:    Induction: Intravenous  Airway Management Planned: LMA  Additional Equipment:   Intra-op Plan:   Post-operative Plan: Extubation in OR  Informed Consent: I have reviewed the patients History and Physical, chart, labs and discussed the procedure including the risks, benefits and alternatives for the proposed anesthesia with the patient or authorized representative who has indicated his/her understanding and acceptance.   Dental advisory given  Plan Discussed with: CRNA and Surgeon  Anesthesia Plan Comments:         Anesthesia Quick Evaluation  

## 2016-03-01 NOTE — Telephone Encounter (Signed)
See jills note, left message for patient he needs a re eval. before referral. I see he is at an ortho? Today.

## 2016-03-01 NOTE — Anesthesia Postprocedure Evaluation (Signed)
Anesthesia Post Note  Patient: Kyle Leonard  Procedure(s) Performed: Procedure(s) (LRB): IRRIGATION AND DEBRIDEMENT OPEN WOUND RIGHT HAND (Right)  Patient location during evaluation: PACU Anesthesia Type: General Level of consciousness: awake and alert Pain management: pain level controlled Vital Signs Assessment: post-procedure vital signs reviewed and stable Respiratory status: spontaneous breathing, nonlabored ventilation, respiratory function stable and patient connected to nasal cannula oxygen Cardiovascular status: blood pressure returned to baseline and stable Postop Assessment: no signs of nausea or vomiting Anesthetic complications: no    Last Vitals:  Vitals:   03/01/16 1924 03/01/16 1930  BP: (!) 134/96   Pulse: 63 (!) 58  Resp: (!) 23 12  Temp:      Last Pain:  Vitals:   03/01/16 1929  TempSrc:   PainSc: 5                  Court [address]acia S

## 2016-03-01 NOTE — Discharge Instructions (Signed)
KEEP BANDAGE CLEAN AND DRY CALL OFFICE FOR F/U APPT [id number] in 2 days KEEP HAND ELEVATED ABOVE HEART OK TO APPLY ICE TO OPERATIVE AREA CONTACT OFFICE IF ANY WORSENING PAIN OR CONCERNS.  

## 2016-03-01 NOTE — H&P (Signed)
Petra KubaDon A Angevine is an 50 y.o. male.   Chief Complaint: right hand swelling and pain HPI: Pt sustained dog bite three weeks ago Presented to office with worsening pain and swelling Concerned about right long finger redness and mass over dorsum of hand  No past medical history on file.  Past Surgical History:  Procedure Laterality Date  . NO PAST SURGERIES      No family history on file. Social History:  reports that he has never smoked. He has never used smokeless tobacco. He reports that he drinks about 3.6 oz of alcohol per week . He reports that he does not use drugs.  Allergies: No Known Allergies  Medications Prior to Admission  Medication Sig Dispense Refill  . ibuprofen (ADVIL,MOTRIN) 200 MG tablet Take 200 mg by mouth every 6 (six) hours as needed.    Marland Kitchen. amoxicillin-clavulanate (AUGMENTIN) 875-125 MG tablet Take 1 tablet by mouth 2 (two) times daily. X 10 days 20 tablet 0  . doxycycline (VIBRA-TABS) 100 MG tablet Take 1 tablet (100 mg total) by mouth 2 (two) times daily. X 10 days 20 tablet 0    No results found for this or any previous visit (from the past 48 hour(s)). No results found.  ROS: NO RECENT ILLNESSES OR HOSPITALIZATIONS  Blood pressure (!) 128/91, pulse 61, temperature 98.1 F (36.7 C), temperature source Oral, resp. rate 18, height 6' 2.5" (1.892 m), weight 214 lb (97.1 kg), SpO2 99 %. Physical Exam  General Appearance:  Alert, cooperative, no distress, appears stated age  Head:  Normocephalic, without obvious abnormality, atraumatic  Eyes:  Pupils equal, conjunctiva/corneas clear,         Throat: Lips, mucosa, and tongue normal; teeth and gums normal  Neck: No visible masses     Lungs:   respirations unlabored  Chest Wall:  No tenderness or deformity  Heart:  Regular rate and rhythm,  Abdomen:   Soft, non-tender,         Extremities: RIGHT HAND: FLUCTUANT AREA OVER DORSUM OF HAND FINGERS WARM WELL PERFUSED ERYTHEMA OVER DORSUM OF LONG FINGER MP  JOINT GOOD MOBILITY OF INDEX/RING/SMALL FINGERS WARM WELL PERFUSED  Pulses: 2+ and symmetric  Skin: Skin color, texture, turgor normal, no rashes or lesions     Neurologic: Normal    Assessment/Plan RIGHT LONG FINGER DOG BITE WITH DORSAL ABSCESS AND POSSIBLE JOINT INFECTION  R/B/A DISCUSSED WITH PT IN OFFICE.  PT VOICED UNDERSTANDING OF PLAN CONSENT SIGNED DAY OF SURGERY PT SEEN AND EXAMINED PRIOR TO OPERATIVE PROCEDURE/DAY OF SURGERY SITE MARKED. QUESTIONS ANSWERED WILL GO HOME FOLLOWING SURGERY  WE ARE PLANNING SURGERY FOR YOUR UPPER EXTREMITY. THE RISKS AND BENEFITS OF SURGERY INCLUDE BUT NOT LIMITED TO BLEEDING INFECTION, DAMAGE TO NEARBY NERVES ARTERIES TENDONS, FAILURE OF SURGERY TO ACCOMPLISH ITS INTENDED GOALS, PERSISTENT SYMPTOMS AND NEED FOR FURTHER SURGICAL INTERVENTION. WITH THIS IN MIND WE WILL PROCEED. I HAVE DISCUSSED WITH THE PATIENT THE PRE AND POSTOPERATIVE REGIMEN AND THE DOS AND Erron'TS. PT VOICED UNDERSTANDING AND INFORMED CONSENT SIGNED. Sharma CovertORTMANN,Coleston Dirosa W 03/01/2016, 4:09 PM

## 2016-03-01 NOTE — Anesthesia Procedure Notes (Signed)
Procedure Name: LMA Insertion Date/Time: 03/01/2016 6:21 PM Performed by: Geraldo DockerSOLHEIM, Sherece Gambrill SALOMON Pre-anesthesia Checklist: Patient identified, Patient being monitored, Timeout performed, Emergency Drugs available and Suction available Patient Re-evaluated:Patient Re-evaluated prior to inductionOxygen Delivery Method: Circle System Utilized Preoxygenation: Pre-oxygenation with 100% oxygen Intubation Type: IV induction Ventilation: Mask ventilation without difficulty LMA: LMA with gastric port inserted LMA Size: 5.0 Number of attempts: 1 Placement Confirmation: positive ETCO2 and breath sounds checked- equal and bilateral Tube secured with: Tape Dental Injury: Teeth and Oropharynx as per pre-operative assessment

## 2016-03-02 ENCOUNTER — Encounter (HOSPITAL_COMMUNITY): Payer: Self-pay | Admitting: Orthopedic Surgery

## 2016-03-02 NOTE — Op Note (Signed)
NAMELEVAR, FAYSON                     ACCOUNT NO.:  [id number]  MEDICAL RECORD NO.:  [id number]  LOCATION:  [facility]                         FACILITY:  [facility]  PHYSICIAN:  Sharma Covert IV, M.D.DATE OF BIRTH:  [birth year]  DATE OF PROCEDURE:  03/01/2016 DATE OF DISCHARGE:  03/01/2016                              OPERATIVE REPORT   PREOPERATIVE DIAGNOSIS:  Right hand abscess.  POSTOPERATIVE DIAGNOSIS:  Right hand abscess.  ATTENDING PHYSICIAN:  Sharma Covert, M.D., who scrubbed and present for the entire procedure.  ASSISTANT SURGEON:  None.  ANESTHESIA:  General via LMA.  SURGICAL PROCEDURE: 1. Right hand drainage of right deep abscess, dorsum of the long     finger. 2. Right long finger metacarpophalangeal joint arthrotomy,     exploration, and drainage. 3. Repair of right long finger extensor tendon, dorsum of the hand,     zone 5.  SURGICAL INDICATIONS:  Mr. Dimichele is a right-hand-dominant gentleman who sustained a dog bite over 3 weeks ago, presented to the office with persistent infection.  The patient was seen and evaluated and recommended to undergo the above procedure.  Risks, benefits, and alternatives were discussed in detail with the patient and signed informed consent was obtained.  Risks include, but not limited to bleeding, infection, damage to nearby nerves, arteries, or tendons; loss of motion to the wrist and digits, incomplete relief of symptoms, and need for further surgical intervention.  DESCRIPTION OF PROCEDURE:  The patient was properly identified in the preoperative holding area and a mark with a permanent marker made on the right hand to indicate the correct operative site.  The patient was then brought back to the operating room, placed supine on the anesthesia room table where general anesthetic was administered.  Preoperative antibiotics were held prior to cultures.  A well-padded tourniquet was placed on the right brachium and sealed with 1000 drape.   The right upper extremity was then prepped and draped in normal sterile fashion. Time-out was called, correct side was identified, and procedure then begun.  Attention was then turned to the right hand.  Curvilinear incision was made directly over the abscess area.  Skin and subcutaneous tissue from the abscess area were then excised.  Deep dissection carried down to the extensor mechanism.  The devitalized tissue and necrotic tissue were sent for culture.  The purulent fluid was sent for culture. The did have the tear of the extensor mechanism, central tear.  Joint arthrotomy was then extended and the joint was then thoroughly irrigated.  Copious amount of irrigation placed throughout the joint. After joint arthrotomy and debridement, the extensor mechanism was then repaired with Ethibond suture.  Wound was irrigated and each layer of closure.  The skin was then closed with 4-0 Ethibond suture.  Adaptic dressing, sterile compressive bandage were then applied.  The patient was placed in a well-padded volar splint keeping the hand in in full extension.  Extubated and taken to recovery room in good condition.  POSTPROCEDURAL PLAN:  The patient will be discharged to home, seen back in the office in 2 days for wound check, and to see our therapist for a  splint to protect the MP joint to the long finger till the soft tissues heal and immobilization of the long finger MP joint for the next 2 weeks and then will begin some gentle activity and gradual use of motion of the hand on the middle finger.     Madelynn DoneFred W. Chieko Neises IV, M.D.     FWO/MEDQ  D:  03/01/2016  T:  03/02/2016  Job:  [id number]

## 2016-03-06 LAB — AEROBIC/ANAEROBIC CULTURE W GRAM STAIN (SURGICAL/DEEP WOUND): Culture: NO GROWTH

## 2016-03-06 LAB — AEROBIC/ANAEROBIC CULTURE (SURGICAL/DEEP WOUND)

## 2016-03-09 LAB — AEROBIC/ANAEROBIC CULTURE W GRAM STAIN (SURGICAL/DEEP WOUND)

## 2016-03-09 LAB — AEROBIC/ANAEROBIC CULTURE (SURGICAL/DEEP WOUND)

## 2016-08-09 ENCOUNTER — Ambulatory Visit (INDEPENDENT_AMBULATORY_CARE_PROVIDER_SITE_OTHER): Payer: No Typology Code available for payment source | Admitting: Emergency Medicine

## 2016-08-09 VITALS — BP 128/82 | HR 66 | Temp 98.2°F | Resp 17 | Ht 75.5 in | Wt 216.0 lb

## 2016-08-09 DIAGNOSIS — M26622 Arthralgia of left temporomandibular joint: Secondary | ICD-10-CM | POA: Diagnosis not present

## 2016-08-09 NOTE — Patient Instructions (Signed)
     IF you received an x-Daft today, you will receive an invoice from [facility]. Please contact [facility] at [phone] with questions or concerns regarding your invoice.   IF you received labwork today, you will receive an invoice from [facility]. Please contact [facility] at [phone] with questions or concerns regarding your invoice.   Our billing staff will not be able to assist you with questions regarding bills from these companies.  You will be contacted with the lab results as soon as they are available. The fastest way to get your results is to activate your My Chart account. Instructions are located on the last page of this paperwork. If you have not heard from us regarding the results in 2 weeks, please contact this office.     

## 2016-08-09 NOTE — Progress Notes (Signed)
Kyle Leonard 51 y.o.   Chief Complaint  Patient presents with  . Ear Pain    HISTORY OF PRESENT ILLNESS: This is a 51 y.o. male complaining of pain to left TMJ area x 2 weeks; denies trauma or any other significant symptoms. Denies fever, rash, periauricular pain, n/v, dental pain, URI symptoms, ear discharge. HPI   Prior to Admission medications   Not on File    No Known Allergies  There are no active problems to display for this patient.   No past medical history on file.  Past Surgical History:  Procedure Laterality Date  . I&D EXTREMITY Right 03/01/2016   Procedure: IRRIGATION AND DEBRIDEMENT OPEN WOUND RIGHT HAND;  Surgeon: Bradly BienenstockFred Ortmann, MD;  Location: [facility] OR;  Service: Orthopedics;  Laterality: Right;  . NO PAST SURGERIES      Social History   Social History  . Marital status: Single    Spouse name: N/A  . Number of children: N/A  . Years of education: N/A   Occupational History  . employed     Investment banker, corporateproperty manager   Social History Main Topics  . Smoking status: Never Smoker  . Smokeless tobacco: Never Used  . Alcohol use 3.6 oz/week    6 Cans of beer per week  . Drug use: No  . Sexual activity: Yes   Other Topics Concern  . Not on file   Social History Narrative  . No narrative on file    No family history on file.   Review of Systems  Constitutional: Negative.  Negative for chills, fever, malaise/fatigue and weight loss.  HENT: Negative for congestion, ear discharge, ear pain, hearing loss, nosebleeds and sore throat.   Eyes: Negative for blurred vision, double vision, discharge and redness.  Respiratory: Negative for cough, hemoptysis and shortness of breath.   Cardiovascular: Negative for chest pain and palpitations.  Gastrointestinal: Negative for nausea and vomiting.  Genitourinary: Negative for dysuria and hematuria.  Musculoskeletal: Negative for myalgias and neck pain.  Skin: Negative for rash.  Neurological: Negative for dizziness,  sensory change, focal weakness and headaches.  All other systems reviewed and are negative.  Vitals:   08/09/16 1640  BP: 128/82  Pulse: 66  Resp: 17  Temp: 98.2 F (36.8 C)    Physical Exam  Constitutional: He appears well-developed and well-nourished.  HENT:  Head: Normocephalic and atraumatic.  Right Ear: External ear normal.  Left Ear: External ear normal.  Nose: Nose normal.  Mouth/Throat: Oropharynx is clear and moist. No oropharyngeal exudate.  No TMJ erythema or tenderness.  Eyes: Conjunctivae and EOM are normal. Pupils are equal, round, and reactive to light.  Neck: Normal range of motion. Neck supple. No JVD present. No thyromegaly present.  Cardiovascular: Normal rate, regular rhythm and normal heart sounds.   Pulmonary/Chest: Effort normal and breath sounds normal.  Musculoskeletal: Normal range of motion.  Neurological: He is alert. No sensory deficit. He exhibits normal muscle tone.  Skin: Skin is warm and dry. Capillary refill takes less than 2 seconds. No rash noted.  Psychiatric: He has a normal mood and affect. His behavior is normal.  Vitals reviewed.    ASSESSMENT & PLAN: Kyle Leonard was seen today for ear pain.  Diagnoses and all orders for this visit:  Arthralgia of left temporomandibular joint     Edwina BarthMiguel Jaqwon Manfred, MD [facility]

## 2017-07-09 IMAGING — DX DG HAND COMPLETE 3+V*R*
3 series · 3 of 3 positions shown · non-contrast
Comparison: None.

CLINICAL DATA: Swelling at the third MCP joint after being bitten
by a dog 1 week ago. Initial encounter.

EXAM:
RIGHT HAND - COMPLETE 3+ VIEW

[hand pa]
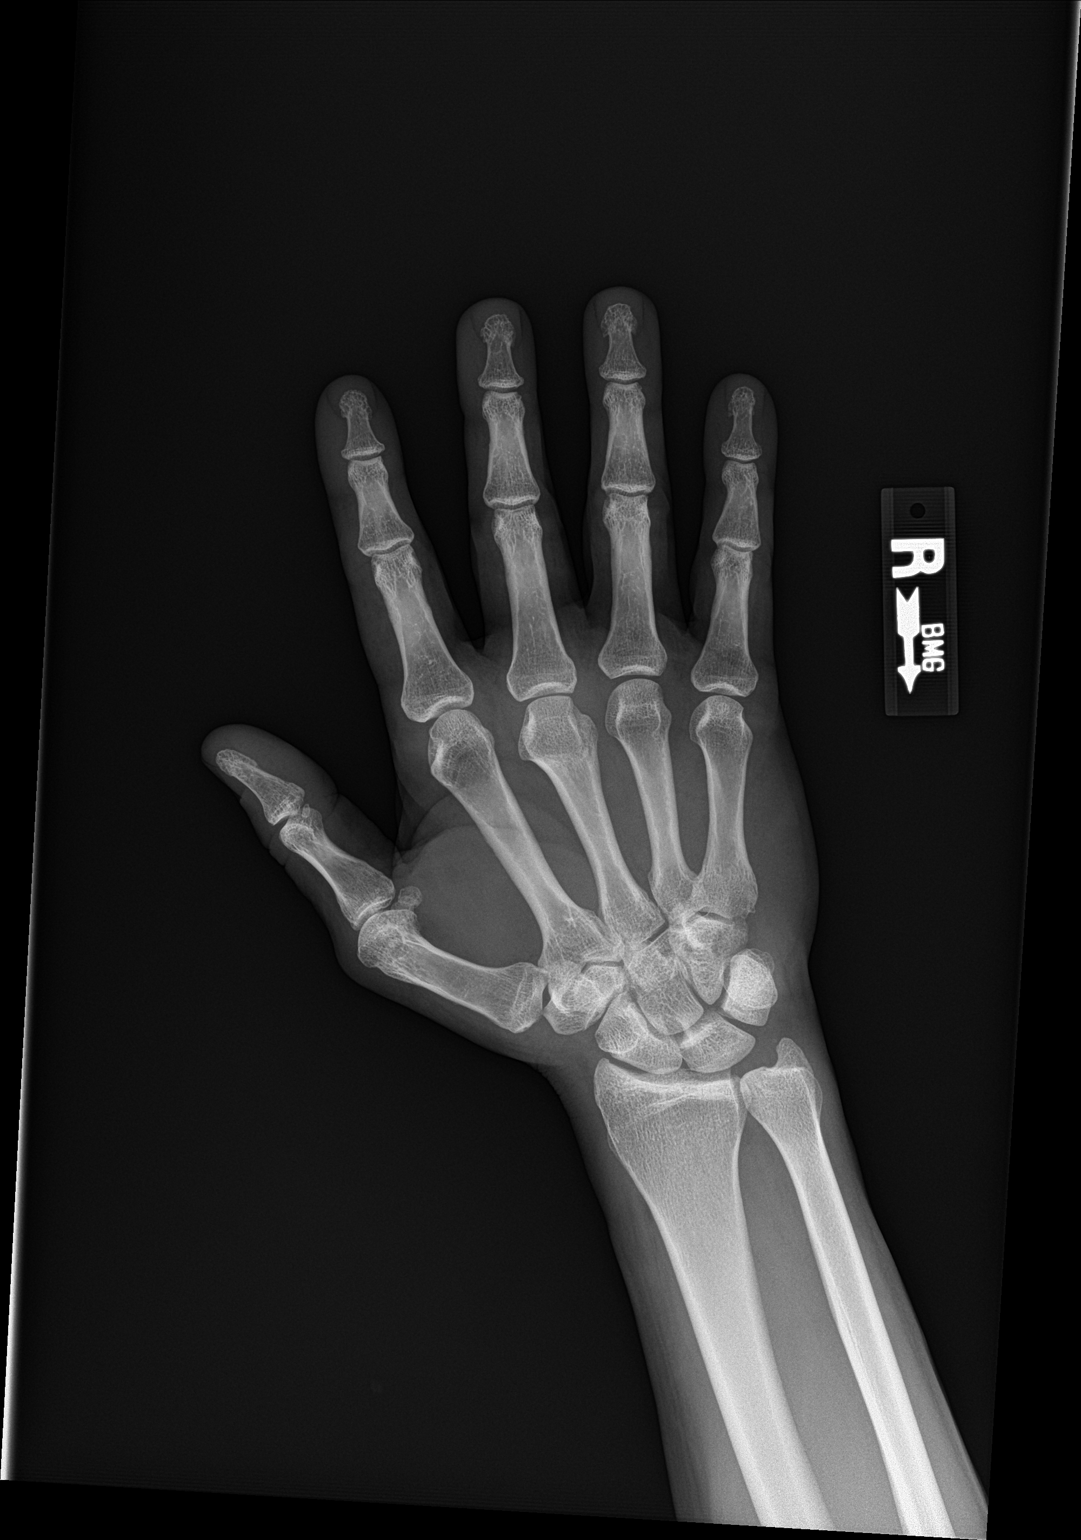

[hand obl]
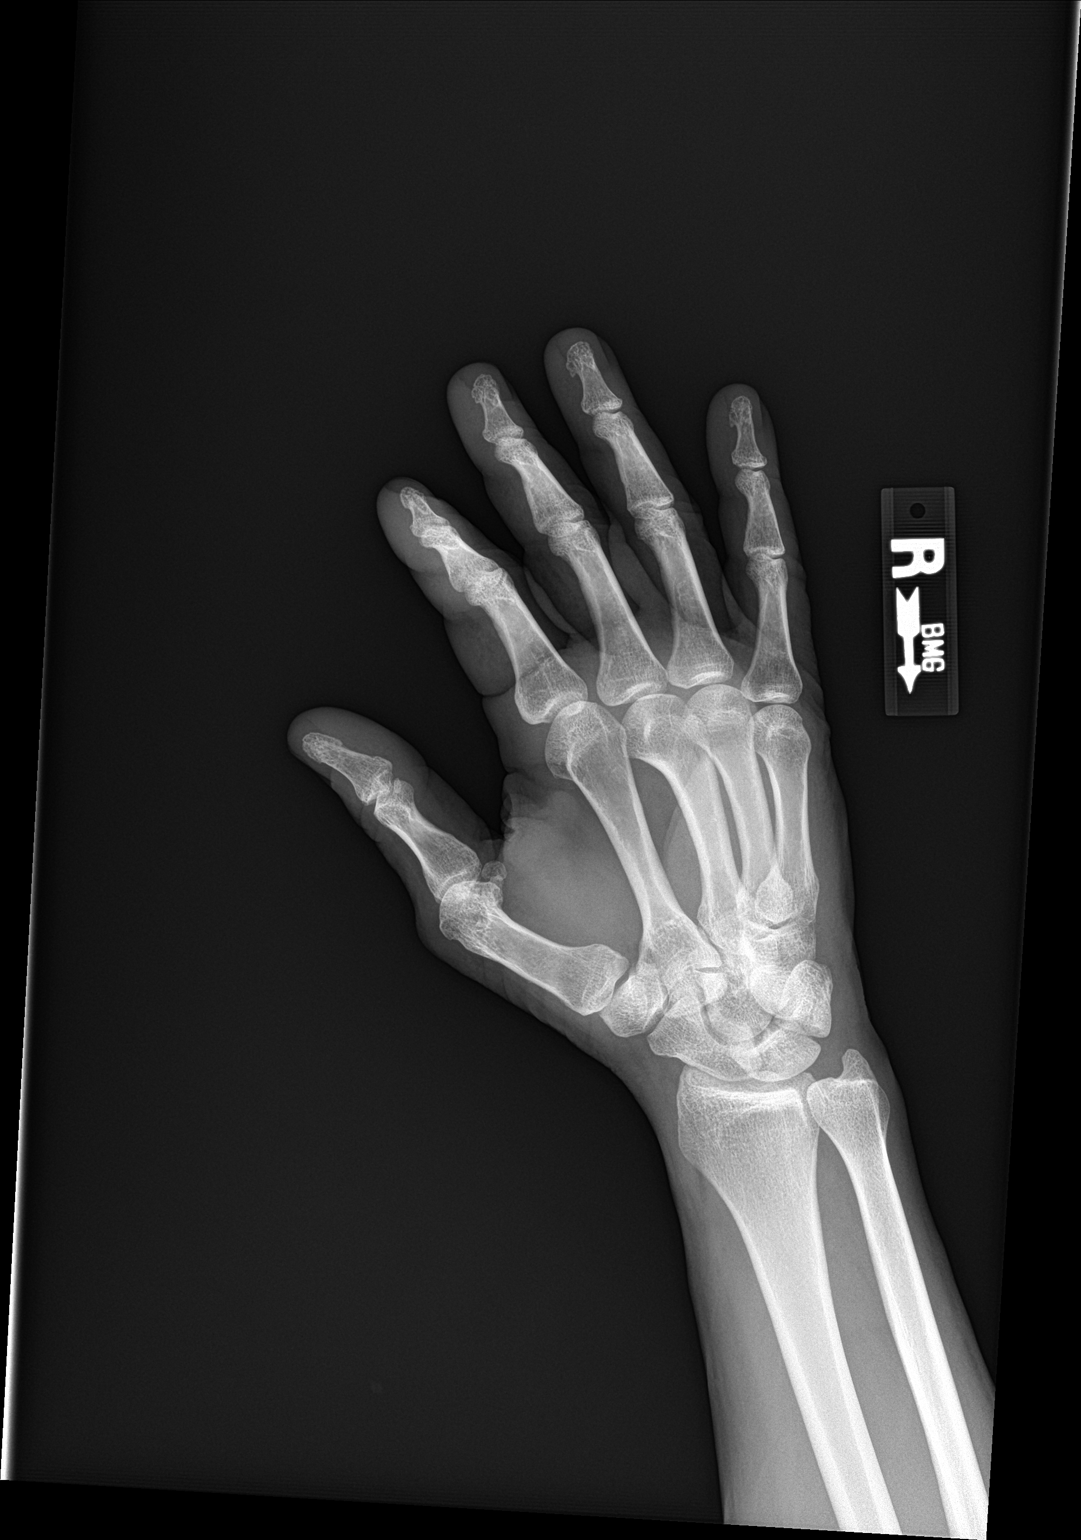

[hand lat]
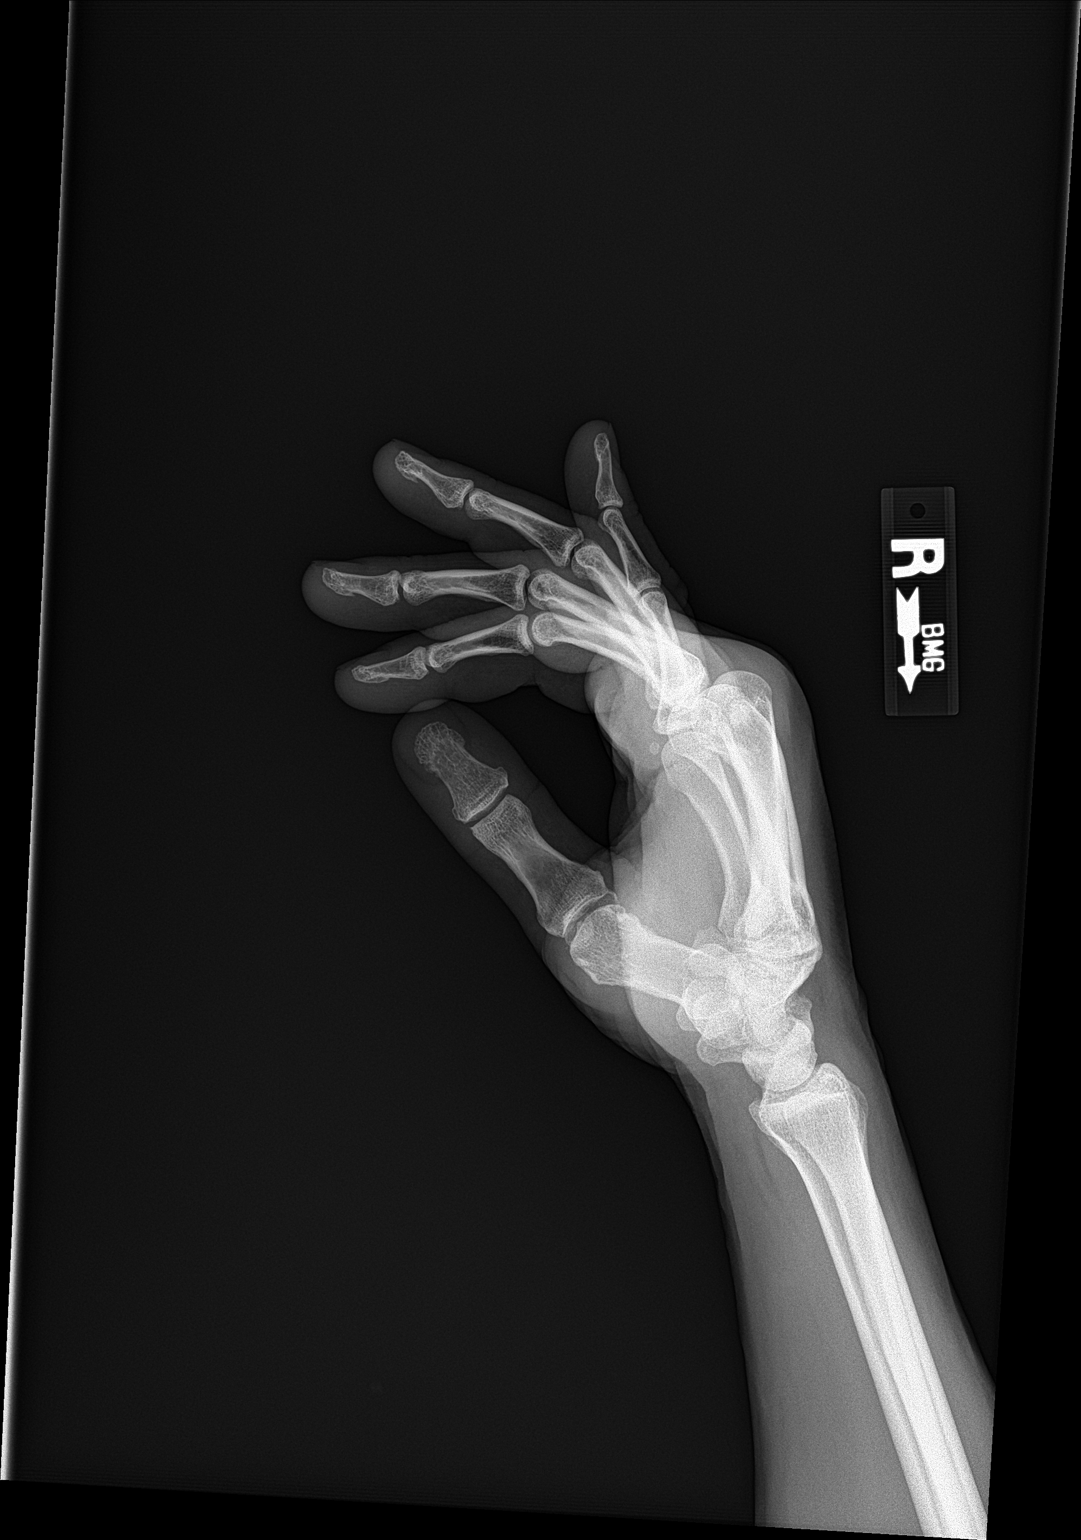

[3 of 3 positions shown; findings below may reference images not displayed]

FINDINGS: The mineralization and alignment are normal. There is no evidence of
acute fracture or dislocation. The joint spaces are maintained.
There is soft tissue swelling dorsally over the knuckles on the
lateral view. No foreign body or soft tissue emphysema identified.
There is no evidence of bone destruction.
IMPRESSION: Dorsal soft tissue swelling without evidence of foreign body,
osteomyelitis or acute osseous findings.

## 2018-12-23 ENCOUNTER — Other Ambulatory Visit: Payer: Self-pay

## 2018-12-23 DIAGNOSIS — Z20822 Contact with and (suspected) exposure to covid-19: Secondary | ICD-10-CM

## 2018-12-24 LAB — NOVEL CORONAVIRUS, NAA: SARS-CoV-2, NAA: DETECTED — AB

## 2018-12-31 ENCOUNTER — Other Ambulatory Visit: Payer: Self-pay

## 2018-12-31 DIAGNOSIS — Z20822 Contact with and (suspected) exposure to covid-19: Secondary | ICD-10-CM

## 2019-01-02 LAB — NOVEL CORONAVIRUS, NAA: SARS-CoV-2, NAA: NOT DETECTED

## 2019-01-03 ENCOUNTER — Other Ambulatory Visit: Payer: Self-pay

## 2019-01-03 DIAGNOSIS — Z20822 Contact with and (suspected) exposure to covid-19: Secondary | ICD-10-CM

## 2019-01-05 LAB — NOVEL CORONAVIRUS, NAA: SARS-CoV-2, NAA: NOT DETECTED
